# Patient Record
Sex: Female | Born: 1970 | Race: White | Hispanic: No | Marital: Married | Smoking: Never smoker
Health system: Southern US, Community
[De-identification: ages and names within clinical notes are randomized; demographics above are authoritative.]

## PROBLEM LIST (undated history)

## (undated) DIAGNOSIS — I10 Essential (primary) hypertension: Secondary | ICD-10-CM

---

## 2011-11-21 DIAGNOSIS — F419 Anxiety disorder, unspecified: Secondary | ICD-10-CM | POA: Insufficient documentation

## 2012-03-21 DIAGNOSIS — K219 Gastro-esophageal reflux disease without esophagitis: Secondary | ICD-10-CM | POA: Insufficient documentation

## 2012-05-24 DIAGNOSIS — G8929 Other chronic pain: Secondary | ICD-10-CM | POA: Insufficient documentation

## 2012-05-24 DIAGNOSIS — M545 Low back pain: Secondary | ICD-10-CM | POA: Insufficient documentation

## 2013-01-04 DIAGNOSIS — G8929 Other chronic pain: Secondary | ICD-10-CM | POA: Insufficient documentation

## 2013-01-24 DIAGNOSIS — I10 Essential (primary) hypertension: Secondary | ICD-10-CM | POA: Insufficient documentation

## 2015-02-09 ENCOUNTER — Other Ambulatory Visit: Payer: Self-pay | Admitting: Thoracic Surgery

## 2015-02-09 ENCOUNTER — Ambulatory Visit
Admission: RE | Admit: 2015-02-09 | Discharge: 2015-02-09 | Disposition: A | Discharge status: Home / Self Care | Payer: Disability Insurance | Source: Ambulatory Visit | Attending: Thoracic Surgery | Admitting: Thoracic Surgery

## 2015-02-09 DIAGNOSIS — S9491XD Injury of unspecified nerve at ankle and foot level, right leg, subsequent encounter: Secondary | ICD-10-CM | POA: Insufficient documentation

## 2015-02-09 DIAGNOSIS — G5791 Unspecified mononeuropathy of right lower limb: Secondary | ICD-10-CM

## 2016-04-08 ENCOUNTER — Ambulatory Visit: Payer: Self-pay | Admitting: Podiatry

## 2016-04-20 ENCOUNTER — Encounter: Payer: Self-pay | Admitting: Podiatry

## 2016-04-20 ENCOUNTER — Ambulatory Visit (INDEPENDENT_AMBULATORY_CARE_PROVIDER_SITE_OTHER): Payer: BLUE CROSS/BLUE SHIELD

## 2016-04-20 ENCOUNTER — Ambulatory Visit (INDEPENDENT_AMBULATORY_CARE_PROVIDER_SITE_OTHER): Payer: BLUE CROSS/BLUE SHIELD | Admitting: Podiatry

## 2016-04-20 VITALS — BP 138/88 | HR 76 | Resp 16 | Ht 65.0 in | Wt 195.0 lb

## 2016-04-20 DIAGNOSIS — M216X9 Other acquired deformities of unspecified foot: Secondary | ICD-10-CM

## 2016-04-20 DIAGNOSIS — M722 Plantar fascial fibromatosis: Secondary | ICD-10-CM | POA: Diagnosis not present

## 2016-04-20 MED ORDER — DICLOFENAC SODIUM 75 MG PO TBEC: 75.0000 mg | DELAYED_RELEASE_TABLET | Freq: Two times a day (BID) | ORAL | 2 refills | Status: DC

## 2016-04-20 MED ORDER — TRIAMCINOLONE ACETONIDE 10 MG/ML IJ SUSP
10.0000 mg | Freq: Once | INTRAMUSCULAR | Status: AC
  Administered 2016-04-20: 10 mg

## 2016-04-20 NOTE — Progress Notes (Signed)
Subjective:     Patient ID: Rita SchlichterYonna Townsend, female   DOB: 09-Oct-1970, 46 y.o.   MRN: [id number]  HPI patient presents with long-term problems with her feet with severe keratotic lesion formations and more recent chronic plantar fasciitis bilateral. Patient states his big on for number years and getting worse   Review of Systems  All other systems reviewed and are negative.      Objective:   Physical Exam  Constitutional: She is oriented to person, place, and time.  Cardiovascular: Intact distal pulses.   Musculoskeletal: Normal range of motion.  Neurological: She is oriented to person, place, and time.  Skin: Skin is warm.  Nursing note and vitals reviewed.  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 Neck and plantar fascial symptomatology bilateral that makes it hard for her to be active or walk and she is moderately obese     Assessment:     Chronic fasciitis bilateral heels along with severe keratotic lesion secondary to foot structure    Plan:     H&P x-rays reviewed conditions discussed and discussed with the patient the relationship to foot structure. At this time we'll focus on the heels and I injected the plantar fascial bilateral 3 mg Kenalog 5 mill grams Xylocaine and applied fascial brace is bilateral and went ahead and placed on diclofenac 75 mg twice a day. I think this patient will benefit from a softer type orthotic to try to cushion the forefoot but I want to wait see what type depending on response to medication  X-ray report indicates there is spur in the plantar heel high cavus foot structure and no indications of stress fracture

## 2016-04-20 NOTE — Progress Notes (Signed)
   Subjective:    Patient ID: Rita SchlichterYonna Townsend, female    DOB: 11-02-1970, 46 y.o.   MRN: [id number]  HPI Chief Complaint  Patient presents with  . Foot Pain    Bilateral; bottom of heel; knots on medial side; pt stated, "Has had knot on Left foot for 2 years; has had knot on right foot for past 2 months"  . Callouses    Bilateral; great toes & plantar forefoot-below great toe; pt stated, "Has used a pummice stone with relief"; x2 years      Review of Systems  All other systems reviewed and are negative.      Objective:   Physical Exam        Assessment & Plan:

## 2016-04-20 NOTE — Patient Instructions (Signed)

## 2016-04-25 ENCOUNTER — Telehealth: Payer: Self-pay | Admitting: *Deleted

## 2016-04-25 NOTE — Telephone Encounter (Signed)
Pt left name, DOB and phone number. Pt states having severe cramping in plantar feet, heels and calf after 3-4 days of wearing the braces, stopped wearing the brace and the pain has begun to go away. I asked pt how she was putting the brace on and she states she takes it apart to put it on each time and it is comfortable at first then becomes painful. I instructed pt to stop the braces, go in to shoes with arch support, and begin ice therapy 3-4 times a day for 15-20 minutes each session, continue the diclofenac. Pt states she is taking both ibuprofen and diclofenac. I told her just to take the diclofenac, because taking both could cause stomach problems or cancel each other out. Pt states understanding and state she had Finn Comfort clog, because she can't wear tie shoes. I told her the clogs may be increasing the inflammation if she has to use her toes to grip to hold the clog on. I told pt to call for an earlier appt if problems.  Pt states understanding.

## 2016-05-04 ENCOUNTER — Encounter: Payer: Self-pay | Admitting: Podiatry

## 2016-05-04 ENCOUNTER — Ambulatory Visit (INDEPENDENT_AMBULATORY_CARE_PROVIDER_SITE_OTHER): Payer: BLUE CROSS/BLUE SHIELD | Admitting: Podiatry

## 2016-05-04 DIAGNOSIS — M722 Plantar fascial fibromatosis: Secondary | ICD-10-CM

## 2016-05-04 MED ORDER — TRIAMCINOLONE ACETONIDE 10 MG/ML IJ SUSP
10.0000 mg | Freq: Once | INTRAMUSCULAR | Status: AC
  Administered 2016-05-04: 10 mg

## 2016-05-06 NOTE — Progress Notes (Signed)
Subjective:     Patient ID: Rita Townsend, female   DOB: [birth year], 46 y.o.   MRN: [id number]  HPI patient presents stating I'm improved but I'm still having some discomfort in my heel with ambulation and states the left one is really bothering her with a right significantly improved   Review of Systems     Objective:   Physical Exam Neurovascular status intact muscle strength adequate range of motion within normal limits with patient's heel still being sore on the left with inflammation in the right one significantly improved    Assessment:     Improvement of fascially right with inflammation noted left    Plan:     Injected the left fascia 3 mg Kenalog 5 mg Xylocaine and instructed on physical therapy anti-inflammatories

## 2016-05-25 ENCOUNTER — Ambulatory Visit: Payer: BLUE CROSS/BLUE SHIELD | Admitting: Podiatry

## 2016-06-27 ENCOUNTER — Ambulatory Visit (INDEPENDENT_AMBULATORY_CARE_PROVIDER_SITE_OTHER): Payer: BLUE CROSS/BLUE SHIELD | Admitting: Podiatry

## 2016-06-27 DIAGNOSIS — M216X9 Other acquired deformities of unspecified foot: Secondary | ICD-10-CM

## 2016-06-27 DIAGNOSIS — M722 Plantar fascial fibromatosis: Secondary | ICD-10-CM

## 2016-06-27 MED ORDER — TRIAMCINOLONE ACETONIDE 10 MG/ML IJ SUSP
10.0000 mg | Freq: Once | INTRAMUSCULAR | Status: AC
  Administered 2016-06-27: 10 mg

## 2016-06-27 NOTE — Progress Notes (Signed)
Subjective:    Patient ID: Rita SchlichterYonna Townsend, female   DOB: 46 y.o.   MRN: [id number]   HPI patient continues to have pain in the heel left over right with inflammation fluid around the medial band and states that it was very bad recently when she wore a flat shoe    ROS      Objective:  Physical Exam neurovascular status intact negative Homans sign was noted with patient noted to have exquisite discomfort heel left over right with inflammation fluid around the medial band     Assessment:   Continued acute plantar fasciitis left over right      Plan:    H&P condition reviewed and at this point I did just for the left inject the plantar fascia 3 Kenalog 5 mill grams Xylocaine I dispensed a night splint with instructions on usage along with ice packs and I went ahead and I scanned for custom orthotics to reduce plantar pressure against the feet and hopefully prevent her from needing surgery

## 2016-06-29 ENCOUNTER — Telehealth: Payer: Self-pay | Admitting: *Deleted

## 2016-06-29 NOTE — Telephone Encounter (Signed)
Pt states the prescription was not called to the pharmacy. I told pt Dr. Charlsie Merlesegal gave her the antiinflammatory medication in the injection. Pt states understanding.

## 2016-07-18 ENCOUNTER — Ambulatory Visit (INDEPENDENT_AMBULATORY_CARE_PROVIDER_SITE_OTHER): Payer: BLUE CROSS/BLUE SHIELD | Admitting: Podiatry

## 2016-07-18 DIAGNOSIS — M722 Plantar fascial fibromatosis: Secondary | ICD-10-CM

## 2016-07-18 MED ORDER — TRIAMCINOLONE ACETONIDE 10 MG/ML IJ SUSP
10.0000 mg | Freq: Once | INTRAMUSCULAR | Status: AC
  Administered 2016-07-18: 10 mg

## 2016-07-18 NOTE — Patient Instructions (Signed)

## 2016-07-18 NOTE — Progress Notes (Signed)
Subjective:    Patient ID: Rita Townsend, female   DOB: 46 y.o.   MRN: [id number]   HPI patient states I'm having quite a bit of pain in my right heel but my left feels quite a bit better    ROS      Objective:  Physical Exam neurovascular status intact with patient found to have significant discomfort right plantar fashion insertional point tendon calcaneus with the left doing well     Assessment:   Plantar fasciitis right over left heel with orthotics that are now here      Plan:    Dispensed orthotics and injected the right plantar fascia 3 mg Kenalog 5 mg Xylocaine

## 2016-10-28 ENCOUNTER — Encounter (HOSPITAL_BASED_OUTPATIENT_CLINIC_OR_DEPARTMENT_OTHER): Payer: Self-pay | Admitting: *Deleted

## 2016-10-28 ENCOUNTER — Emergency Department (HOSPITAL_BASED_OUTPATIENT_CLINIC_OR_DEPARTMENT_OTHER): Payer: BLUE CROSS/BLUE SHIELD

## 2016-10-28 ENCOUNTER — Emergency Department (HOSPITAL_BASED_OUTPATIENT_CLINIC_OR_DEPARTMENT_OTHER)
Admission: EM | Admit: 2016-10-28 | Discharge: 2016-10-28 | Disposition: A | Discharge status: Home / Self Care | Payer: BLUE CROSS/BLUE SHIELD | Attending: Emergency Medicine | Admitting: Emergency Medicine

## 2016-10-28 DIAGNOSIS — I10 Essential (primary) hypertension: Secondary | ICD-10-CM | POA: Insufficient documentation

## 2016-10-28 DIAGNOSIS — Z79899 Other long term (current) drug therapy: Secondary | ICD-10-CM | POA: Insufficient documentation

## 2016-10-28 DIAGNOSIS — R1010 Upper abdominal pain, unspecified: Secondary | ICD-10-CM | POA: Diagnosis not present

## 2016-10-28 DIAGNOSIS — Z791 Long term (current) use of non-steroidal anti-inflammatories (NSAID): Secondary | ICD-10-CM | POA: Diagnosis not present

## 2016-10-28 HISTORY — DX: Essential (primary) hypertension: I10

## 2016-10-28 LAB — CBC WITH DIFFERENTIAL/PLATELET
BASOS ABS: 0 10*3/uL (ref 0.0–0.1)
BASOS PCT: 0 %
EOS ABS: 0.1 10*3/uL (ref 0.0–0.7)
Eosinophils Relative: 1 %
HCT: 42.8 % (ref 36.0–46.0)
HEMOGLOBIN: 14.1 g/dL (ref 12.0–15.0)
Lymphocytes Relative: 25 %
Lymphs Abs: 1.6 10*3/uL (ref 0.7–4.0)
MCH: 28.9 pg (ref 26.0–34.0)
MCHC: 32.9 g/dL (ref 30.0–36.0)
MCV: 87.7 fL (ref 78.0–100.0)
Monocytes Absolute: 0.6 10*3/uL (ref 0.1–1.0)
Monocytes Relative: 10 %
Neutro Abs: 4.1 10*3/uL (ref 1.7–7.7)
Neutrophils Relative %: 64 %
Platelets: 259 10*3/uL (ref 150–400)
RBC: 4.88 MIL/uL (ref 3.87–5.11)
RDW: 13.6 % (ref 11.5–15.5)
WBC: 6.5 10*3/uL (ref 4.0–10.5)

## 2016-10-28 LAB — COMPREHENSIVE METABOLIC PANEL
ALBUMIN: 4.4 g/dL (ref 3.5–5.0)
ALK PHOS: 63 U/L (ref 38–126)
ALT: 23 U/L (ref 14–54)
ANION GAP: 9 (ref 5–15)
AST: 24 U/L (ref 15–41)
BUN: 15 mg/dL (ref 6–20)
CALCIUM: 9.8 mg/dL (ref 8.9–10.3)
CO2: 27 mmol/L (ref 22–32)
Chloride: 102 mmol/L (ref 101–111)
Creatinine, Ser: 0.88 mg/dL (ref 0.44–1.00)
GFR calc Af Amer: >60 mL/min (ref >60)
GFR calc non Af Amer: >60 mL/min (ref >60)
GLUCOSE: 103 mg/dL — AB (ref 65–99)
Potassium: 3.6 mmol/L (ref 3.5–5.1)
SODIUM: 138 mmol/L (ref 135–145)
Total Bilirubin: 0.6 mg/dL (ref 0.3–1.2)
Total Protein: 8.2 g/dL — ABNORMAL HIGH (ref 6.5–8.1)

## 2016-10-28 LAB — LIPASE, BLOOD: Lipase: 32 U/L (ref 11–51)

## 2016-10-28 MED ORDER — IOPAMIDOL (ISOVUE-300) INJECTION 61%
100.0000 mL | Freq: Once | INTRAVENOUS | Status: AC | PRN
  Administered 2016-10-28: 100 mL via INTRAVENOUS

## 2016-10-28 MED ORDER — GI COCKTAIL ~~LOC~~
30.0000 mL | Freq: Once | ORAL | Status: AC
  Administered 2016-10-28: 30 mL via ORAL
  Filled 2016-10-28: qty 30

## 2016-10-28 NOTE — ED Notes (Signed)
Pt d/c home by andrea, rn, not this rn.

## 2016-10-28 NOTE — ED Triage Notes (Signed)
Pt reports abd discomfort and constipation, last bm 10 days ago, has used stool softeners, miralax, colon cleanse herbal pills, suppositories and a fleets enema with no results. md at bedside for exam.

## 2016-10-28 NOTE — ED Provider Notes (Signed)
[facility]   CSN: [id number] Arrival date & time: 10/28/16  5366     History   Chief Complaint Chief Complaint  Patient presents with  . Abdominal Pain    HPI Rita Townsend is a 46 y.o. female.  The history is provided by the patient.  Abdominal Pain   This is a new problem. The current episode started more than 1 week ago. The problem occurs constantly. The problem has not changed since onset.The pain is associated with an unknown factor. The pain is located in the epigastric region, LUQ and RUQ. The quality of the pain is aching and cramping. The pain is moderate. Associated symptoms include belching, nausea and constipation. Pertinent negatives include anorexia, fever, diarrhea, vomiting, dysuria and frequency. The symptoms are aggravated by drinking alcohol, eating and palpation. Nothing relieves the symptoms.   46 year old female who presents with abdominal pain. She has a history of hysterectomy, cholecystectomy, GERD. Reports 1 week history of abdominal pain, localized to the upper abdomen, cramping and aching in nature, constant, worse with eating and drinking. Been constipated for one week. Denies any vomiting, fever, or urinary complaints. Has taken stool softeners with MiraLAX daily, 2 Fleet enemas over the past 24 hours, and taken homeopathic bowel cleanse. Has not been able to have a bowel movement.    Past Medical History:  Diagnosis Date  . Hypertension     Patient Active Problem List   Diagnosis Date Noted  . Hypertension goal BP (blood pressure) < 140/90 01/24/2013  . 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 Chronic lower back pain 05/24/2012  . GERD (gastroesophageal reflux disease) 03/21/2012  . Hx of hepatitis C 03/21/2012  . Insomnia 03/21/2012  . Anxiety 11/21/2011    History reviewed. No pertinent surgical history.  OB History    No data available       Home Medications    Prior to Admission medications     Medication Sig Start Date End Date Taking? Authorizing Provider  hydrochlorothiazide (HYDRODIURIL) 25 MG tablet Take 25 mg by mouth daily.    [provider]  ibuprofen (ADVIL,MOTRIN) 800 MG tablet Take 800 mg by mouth 2 (two) times daily.    [provider]  omeprazole (PRILOSEC) 20 MG capsule Take 20 mg by mouth daily.    [provider]    Family History History reviewed. No pertinent family history.  Social History Social History  Substance Use Topics  . Smoking status: Never Smoker  . Smokeless tobacco: Never Used  . Alcohol use Not on file     Allergies   Patient has no known allergies.   Review of Systems Review of Systems  Constitutional: Negative for fever.  Gastrointestinal: Positive for abdominal pain, constipation and nausea. Negative for anorexia, diarrhea and vomiting.  Genitourinary: Negative for dysuria and frequency.  All other systems reviewed and are negative.    Physical Exam Updated Vital Signs BP 123/78 (BP Location: Left Arm)   Pulse 67   Temp 98.1 F (36.7 C) (Oral)   Ht  (1.651 m)   Wt 92.5 kg (204 lb)   SpO2 97%   BMI 33.95 kg/m   Physical Exam Physical Exam  Nursing note and vitals reviewed. Constitutional: Well developed, well nourished, non-toxic, and in no acute distress Head: Normocephalic and atraumatic.  Mouth/Throat: Oropharynx is clear and moist.  Neck: Normal range of motion. Neck supple.  Cardiovascular: Normal rate and regular rhythm.   Pulmonary/Chest: Effort normal and  breath sounds normal.  Abdominal: Soft. There is upper abdominal tenderness. There is no rebound and no guarding.  Musculoskeletal: Normal range of motion.  Neurological: Alert, no facial droop, fluent speech, moves all extremities symmetrically Skin: Skin is warm and dry.  Psychiatric: Cooperative   ED Treatments / Results  Labs (all labs ordered are listed, but only abnormal results are displayed) Labs Reviewed   COMPREHENSIVE METABOLIC PANEL - Abnormal; Notable for the following:       Result Value   Glucose, Bld 103 (*)    Total Protein 8.2 (*)    All other components within normal limits  CBC WITH DIFFERENTIAL/PLATELET  LIPASE, BLOOD    EKG  EKG Interpretation None       Radiology Ct Abdomen Pelvis W Contrast  Result Date: 10/28/2016 CLINICAL DATA:  Constipation. Abdominal pain. Unresponsive to adenoma is and laxatives. EXAM: CT ABDOMEN AND PELVIS WITH CONTRAST TECHNIQUE: Multidetector CT imaging of the abdomen and pelvis was performed using the standard protocol following bolus administration of intravenous contrast. CONTRAST:  ISOVUE-300 IOPAMIDOL (ISOVUE-300) INJECTION 61% COMPARISON:  None. FINDINGS: Lower chest: Normal Hepatobiliary: Liver parenchyma is normal. Previous cholecystectomy. No evidence of ductal dilatation. Pancreas: Normal Spleen: Normal Adrenals/Urinary Tract: Adrenal glands are normal. Kidneys are normal. No cyst, mass, stone or hydronephrosis. Stomach/Bowel: Normal. The patient does not have an unusual amount of fecal matter in the colon. In fact, there is very little fecal matter in the transverse or left colon. Small intestine appears normal. Appendix is normal. Vascular/Lymphatic: Normal Reproductive: Previous hysterectomy.  No pelvic mass. Other: No free fluid or air. Musculoskeletal: Mild lower lumbar degenerative changes. IMPRESSION: The bowel appears normal. There does not appear to be an abnormal amount of fecal matter in the colon. In fact, there is very little fecal matter in the transverse and left colon. Small bowel appears normal. Previous cholecystectomy and hysterectomy. Electronically Signed   By: Paulina Fusi M.D.   On: 10/28/2016 09:42    Procedures Procedures (including critical care time)  Medications Ordered in ED Medications  gi cocktail (Maalox,Lidocaine,Donnatal) (not administered)  iopamidol (ISOVUE-300) 61 % injection 100 mL (100 mLs  Intravenous Contrast Given 10/28/16 0923)     Initial Impression / Assessment and Plan / ED Course  I have reviewed the triage vital signs and the nursing notes.  Pertinent labs & imaging results that were available during my care of the patient were reviewed by me and considered in my medical decision making (see chart for details).     46 year old female with history of cholecystectomy and abdominal hysterectomy who presents with constipation, abdominal pain and distention. She is well-appearing and in no acute distress with normal vital signs on presentation. She has a soft and nonsurgical abdomen, but some pain noted in the upper abdomen. No evidence of fecal impaction. Has tried an extensive bowel regimen without good effect. CT abdomen pelvis are performed to evaluate for potential obstruction versus mass versus other acute processes. This is visualized and shows no acute intra-abdominal processes. Radiology also comments on their being a significant amount of stool within her colon. Overall blood work is also reassuring. With her reassuring workup, patient felt appropriate for outpatient management. She will continue stool softeners as needed, close PCP follow-up recommended. Strict return and follow-up instructions reviewed. She expressed understanding of all discharge instructions and felt comfortable with the plan of care.   Final Clinical Impressions(s) / ED Diagnoses   Final diagnoses:  Upper abdominal pain  New Prescriptions New Prescriptions   No medications on file     Lavera Guise, MD 10/28/16 1009

## 2016-10-28 NOTE — Discharge Instructions (Signed)
Your CT scan and blood work was reassuring. You do not have significant amount of stool on the CT. Continue miralax, and you can increase it as needed from once daily, to twice daily, to three times daily, and so forth to good effect.  Return without fail for worsening symptoms, including fever, worsening pain, intractable vomiting, or any other symptoms concerning to you. Follow-up with your primary care provider in one week.

## 2016-10-28 NOTE — ED Notes (Signed)
Patient transported to CT 

## 2016-10-28 NOTE — ED Notes (Signed)
ED Provider at bedside. 

## 2016-11-29 ENCOUNTER — Encounter: Payer: Self-pay | Admitting: Gastroenterology

## 2017-01-20 ENCOUNTER — Ambulatory Visit: Payer: Disability Insurance | Admitting: Gastroenterology

## 2017-02-23 ENCOUNTER — Other Ambulatory Visit: Payer: Self-pay | Admitting: Family Medicine

## 2017-02-23 ENCOUNTER — Ambulatory Visit
Admission: RE | Admit: 2017-02-23 | Discharge: 2017-02-23 | Disposition: A | Discharge status: Home / Self Care | Payer: BLUE CROSS/BLUE SHIELD | Source: Ambulatory Visit | Attending: Family Medicine | Admitting: Family Medicine

## 2017-02-23 DIAGNOSIS — M79641 Pain in right hand: Secondary | ICD-10-CM

## 2019-03-06 ENCOUNTER — Encounter: Payer: Self-pay | Admitting: Neurology

## 2019-03-06 ENCOUNTER — Other Ambulatory Visit: Payer: Self-pay

## 2019-03-06 DIAGNOSIS — R202 Paresthesia of skin: Secondary | ICD-10-CM

## 2019-03-15 ENCOUNTER — Emergency Department (HOSPITAL_COMMUNITY)
Admission: EM | Admit: 2019-03-15 | Discharge: 2019-03-15 | Disposition: A | Discharge status: Home / Self Care | Payer: BC Managed Care – PPO | Attending: Emergency Medicine | Admitting: Emergency Medicine

## 2019-03-15 ENCOUNTER — Other Ambulatory Visit: Payer: Self-pay

## 2019-03-15 ENCOUNTER — Emergency Department (HOSPITAL_COMMUNITY): Payer: BC Managed Care – PPO

## 2019-03-15 ENCOUNTER — Encounter (HOSPITAL_COMMUNITY): Payer: Self-pay

## 2019-03-15 DIAGNOSIS — Z20822 Contact with and (suspected) exposure to covid-19: Secondary | ICD-10-CM | POA: Diagnosis not present

## 2019-03-15 DIAGNOSIS — I10 Essential (primary) hypertension: Secondary | ICD-10-CM | POA: Insufficient documentation

## 2019-03-15 DIAGNOSIS — R079 Chest pain, unspecified: Secondary | ICD-10-CM | POA: Diagnosis not present

## 2019-03-15 LAB — CBC
HCT: 41.4 % (ref 36.0–46.0)
Hemoglobin: 13.8 g/dL (ref 12.0–15.0)
MCH: 29.5 pg (ref 26.0–34.0)
MCHC: 33.3 g/dL (ref 30.0–36.0)
MCV: 88.5 fL (ref 80.0–100.0)
Platelets: 239 10*3/uL (ref 150–400)
RBC: 4.68 MIL/uL (ref 3.87–5.11)
RDW: 13.2 % (ref 11.5–15.5)
WBC: 6.9 10*3/uL (ref 4.0–10.5)
nRBC: 0 % (ref 0.0–0.2)

## 2019-03-15 LAB — BASIC METABOLIC PANEL
Anion gap: 11 (ref 5–15)
BUN: 13 mg/dL (ref 6–20)
CO2: 25 mmol/L (ref 22–32)
Calcium: 9.7 mg/dL (ref 8.9–10.3)
Chloride: 105 mmol/L (ref 98–111)
Creatinine, Ser: 0.8 mg/dL (ref 0.44–1.00)
GFR calc Af Amer: >60 mL/min (ref >60)
GFR calc non Af Amer: >60 mL/min (ref >60)
Glucose, Bld: 121 mg/dL — ABNORMAL HIGH (ref 70–99)
Potassium: 3.4 mmol/L — ABNORMAL LOW (ref 3.5–5.1)
Sodium: 141 mmol/L (ref 135–145)

## 2019-03-15 LAB — TROPONIN I (HIGH SENSITIVITY)
Troponin I (High Sensitivity): <2 ng/L (ref <18)
Troponin I (High Sensitivity): <2 ng/L (ref <18)

## 2019-03-15 LAB — POC SARS CORONAVIRUS 2 AG -  ED: SARS Coronavirus 2 Ag: NEGATIVE

## 2019-03-15 LAB — D-DIMER, QUANTITATIVE (NOT AT ARMC): D-Dimer, Quant: 0.61 ug/mL-FEU — ABNORMAL HIGH (ref 0.00–0.50)

## 2019-03-15 MED ORDER — ALBUTEROL SULFATE HFA 108 (90 BASE) MCG/ACT IN AERS
2.0000 | INHALATION_SPRAY | Freq: Once | RESPIRATORY_TRACT | Status: AC
  Administered 2019-03-15: 15:00:00 2 via RESPIRATORY_TRACT
  Filled 2019-03-15: qty 6.7

## 2019-03-15 MED ORDER — SODIUM CHLORIDE (PF) 0.9 % IJ SOLN
INTRAMUSCULAR | Status: AC
  Filled 2019-03-15: qty 50

## 2019-03-15 MED ORDER — FENTANYL CITRATE (PF) 100 MCG/2ML IJ SOLN
50.0000 ug | Freq: Once | INTRAMUSCULAR | Status: AC
  Administered 2019-03-15: 11:00:00 50 ug via INTRAVENOUS
  Filled 2019-03-15: qty 2

## 2019-03-15 MED ORDER — ASPIRIN 81 MG PO CHEW
324.0000 mg | CHEWABLE_TABLET | Freq: Once | ORAL | Status: AC
  Administered 2019-03-15: 11:00:00 324 mg via ORAL
  Filled 2019-03-15: qty 4

## 2019-03-15 MED ORDER — KETOROLAC TROMETHAMINE 15 MG/ML IJ SOLN
15.0000 mg | Freq: Once | INTRAMUSCULAR | Status: AC
  Administered 2019-03-15: 15:00:00 15 mg via INTRAVENOUS
  Filled 2019-03-15: qty 1

## 2019-03-15 MED ORDER — NAPROXEN 500 MG PO TABS: 500.0000 mg | ORAL_TABLET | Freq: Two times a day (BID) | ORAL | 0 refills | Status: AC

## 2019-03-15 MED ORDER — IOHEXOL 350 MG/ML SOLN
100.0000 mL | Freq: Once | INTRAVENOUS | Status: AC | PRN
  Administered 2019-03-15: 100 mL via INTRAVENOUS

## 2019-03-15 MED ORDER — BENZONATATE 100 MG PO CAPS: 100.0000 mg | ORAL_CAPSULE | Freq: Three times a day (TID) | ORAL | 0 refills | Status: AC

## 2019-03-15 NOTE — ED Triage Notes (Signed)
49 yo female c/o midsternal chest pressure since yesterday. Pain sharp with deep breath. Denies SHOB. No n/v or abd pain. C/O minor non-productive cough with HA. Denies ST. Pt had a temp of 101.5 at work on Tuesday and sent home. Denies fever since but +chills.

## 2019-03-15 NOTE — ED Provider Notes (Addendum)
[facility]-EMERGENCY DEPT Provider Note   CSN: [id number] Arrival date & time: 03/15/19  1002     History Chief Complaint  Patient presents with   Chest Pain    Rita Townsend is a 49 y.o. female with a history of anxiety, GERD, and hypertension who presents to the emergency department per PCP for evaluation of chest pain that began yesterday.  Patient states the chest discomfort is substernal, constant, feels like pressure/tightness that becomes sharp and worse with a deep breath.  No other alleviating or aggravating factors.  No change with exertion.  She states that it started yesterday and became more severe today.  She states that on Tuesday they checked her temperature at work per screening protocol and she had a fever, they sent her home told her to get Covid tested today.  Her PCP set up a Covid test today which was performed and is not resulted yet.  States that she has not had any fevers at home but does report some chills, headache, and dry cough.  Denies diaphoresis, nausea, vomiting, dyspnea, syncope, leg pain/swelling, hemoptysis, recent surgery/trauma, recent long travel, hormone use, personal hx of cancer, or hx of DVT/PE.   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   OB History   No obstetric history on file.     No family history on file.  Social History   Tobacco Use   Smoking status: Never Smoker   Smokeless tobacco: Never Used  Substance Use Topics   Alcohol use: Never   Drug use: Never    Home Medications Prior to Admission medications    Medication Sig Start Date End Date Taking? Authorizing Provider  hydrochlorothiazide (HYDRODIURIL) 25 MG tablet Take 25 mg by mouth daily.    [provider]  ibuprofen (ADVIL,MOTRIN) 800 MG tablet Take 800 mg by mouth 2 (two) times daily.    [provider]  omeprazole (PRILOSEC) 20 MG capsule Take 20 mg by mouth daily.    [provider]    Allergies    Patient has no known allergies.  Review of Systems   Review of Systems  Constitutional: Positive for chills. Negative for fever.  Eyes: Negative for visual disturbance.  Respiratory: Positive for cough. Negative for shortness of breath.   Cardiovascular: Positive for chest pain. Negative for leg swelling.  Gastrointestinal: Negative for abdominal pain, blood in stool, constipation, diarrhea, nausea and vomiting.  Genitourinary: Negative for dysuria.  Neurological: Positive for headaches. Negative for dizziness, seizures, syncope, speech difficulty, weakness, light-headedness and numbness.  All other systems reviewed and are negative.   Physical Exam Updated Vital Signs BP (!) 150/87    Pulse 68    Temp 98.3 F (36.8 C) (Oral)    Resp (!) 21    Ht 5\' 5"  (1.651 m)    Wt 94.3 kg    SpO2 96%    BMI 34.61 kg/m    Physical Exam Vitals and nursing note reviewed.  Constitutional:      General: She is not in acute distress.  Appearance: She is well-developed. She is not toxic-appearing.  HENT:     Head: Normocephalic and atraumatic.  Eyes:     General:        Right eye: No discharge.        Left eye: No discharge.     Conjunctiva/sclera: Conjunctivae normal.  Cardiovascular:     Rate and Rhythm: Normal rate and regular rhythm.     Pulses:          Radial pulses are 2+ on the right side and 2+ on the left side.  Pulmonary:     Effort: Pulmonary effort is normal. No respiratory distress.     Breath sounds: Normal breath sounds. No wheezing, rhonchi or rales.  Chest:     Chest wall: No tenderness,  crepitus or edema.     Comments: Just under R breast patient has a 1cm well healed area with 1 sutures in place. No signs of infection.  Abdominal:     General: There is no distension.     Palpations: Abdomen is soft.     Tenderness: There is no abdominal tenderness.  Musculoskeletal:     Cervical back: Neck supple.     Right lower leg: No tenderness. No edema.     Left lower leg: No tenderness. No edema.  Skin:    General: Skin is warm and dry.     Findings: No rash.  Neurological:     Mental Status: She is alert.     Comments: Clear speech.   Psychiatric:        Behavior: Behavior normal.     ED Results / Procedures / Treatments   Labs (all labs ordered are listed, but only abnormal results are displayed) Labs Reviewed  BASIC METABOLIC PANEL - Abnormal; Notable for the following components:      Result Value   Potassium 3.4 (*)    Glucose, Bld 121 (*)    All other components within normal limits  D-DIMER, QUANTITATIVE (NOT AT [facility]) - Abnormal; Notable for the following components:   D-Dimer, Quant 0.61 (*)    All other components within normal limits  CBC  POC SARS CORONAVIRUS 2 AG -  ED  TROPONIN I (HIGH SENSITIVITY)  TROPONIN I (HIGH SENSITIVITY)    EKG None  Radiology CT Angio Chest PE W/Cm &/Or Wo Cm  Result Date: 03/15/2019 CLINICAL DATA:  Central chest pain. Positive D-dimer. EXAM: CT ANGIOGRAPHY CHEST WITH CONTRAST TECHNIQUE: Multidetector CT imaging of the chest was performed using the standard protocol during bolus administration of intravenous contrast. Multiplanar CT image reconstructions and MIPs were obtained to evaluate the vascular anatomy. CONTRAST:  192mL OMNIPAQUE IOHEXOL 350 MG/ML SOLN COMPARISON:  Chest x-ray dated 03/15/2019 FINDINGS: Cardiovascular: Satisfactory opacification of the pulmonary arteries to the segmental level. No evidence of pulmonary embolism. Normal heart size. No pericardial effusion. Mediastinum/Nodes: No enlarged mediastinal,  hilar, or axillary lymph nodes. Trachea and esophagus demonstrate no significant findings. Lungs/Pleura: Lungs are clear. No pleural effusion or pneumothorax. Upper Abdomen: No acute abnormality. Cholecystectomy. Musculoskeletal: No chest wall abnormality. No acute or significant osseous findings. Review of the MIP images confirms the above findings. IMPRESSION: Normal exam. Electronically Signed   By: Lorriane Shire M.D.   On: 03/15/2019 12:59   DG Chest Port 1 View  Result Date: 03/15/2019 CLINICAL DATA:  Chest pain EXAM: PORTABLE CHEST 1 VIEW COMPARISON:  None. FINDINGS: The heart size and mediastinal contours are within normal limits. No focal airspace consolidation, pleural effusion, or  pneumothorax. The visualized skeletal structures are unremarkable. IMPRESSION: No active disease. Electronically Signed   By: Duanne Guess D.O.   On: 03/15/2019 11:00    Procedures Procedures (including critical care time)  Medications Ordered in ED Medications  sodium chloride (PF) 0.9 % injection (has no administration in time range)  aspirin chewable tablet 324 mg (324 mg Oral Given 03/15/19 1055)  fentaNYL (SUBLIMAZE) injection 50 mcg (50 mcg Intravenous Given 03/15/19 1056)  iohexol (OMNIPAQUE) 350 MG/ML injection 100 mL (100 mLs Intravenous Contrast Given 03/15/19 1231)    ED Course  I have reviewed the triage vital signs and the nursing notes.  Pertinent labs & imaging results that were available during my care of the patient were reviewed by me and considered in my medical decision making (see chart for details).    Rita Townsend was evaluated in Emergency Department on 03/15/2019 for the symptoms described in the history of present illness. He/she was evaluated in the context of the global COVID-19 pandemic, which necessitated consideration that the patient might be at risk for infection with the SARS-CoV-2 virus that causes COVID-19. Institutional protocols and algorithms that pertain to the  evaluation of patients at risk for COVID-19 are in a state of rapid change based on information released by regulatory bodies including the CDC and federal and state organizations. These policies and algorithms were followed during the patient's care in the ED.  MDM Rules/Calculators/A&P                      Patient presents to the emergency department with chest pain. Has outpatient covid test pending. Patient nontoxic appearing, in no apparent distress, vitals without significant abnormality, BP elevated. Fairly benign physical exam.   DDX: ACS, pulmonary embolism, dissection, pneumothorax, pneumonia, arrhythmia, severe anemia, MSK, GERD, anxiety, pericarditis. Evaluation initiated with labs, EKG, and CXR. Patient on cardiac monitor.   Rapid Covid test: Negative, outpatient testing by PCP pending. CBC: No anemia or leukocytosis. BMP: Mild hypokalemia, no significant electrolyte derangement.  Renal function preserved. Troponin: < 2, < 2- flat w/o significant elevation.  EKG: No STEMI CXR:  Negative, without infiltrate, effusion, pneumothorax, or fracture/dislocation.  D-dimer positive--> Subsequent CTA negative for PE or other acute process.   [place] Pathway Score low risk- EKG no STEMI, delta troponin negative, doubt ACS. Patient is low risk wells, ddimer positive, CTA w/o PE. Pain is not a tearing sensation, symmetric pulses, no widening of mediastinum on imaging, doubt dissection.  No notable arrhythmias on cardiac monitor.  No diffuse ST changes or pericardial effusion on imaging, doubt pericarditis.  Patient has appeared hemodynamically stable throughout ER visit and appears safe for discharge with close PCP/cardiology follow up.  Will provide symptomatic management.  I discussed results, treatment plan, need for PCP follow-up, and return precautions with the patient. Provided opportunity for questions, patient confirmed understanding and is in agreement with plan.   Final Clinical  Impression(s) / ED Diagnoses Final diagnoses:  Chest pain, unspecified type    Rx / DC Orders ED Discharge Orders         Ordered    naproxen (NAPROSYN) 500 MG tablet  2 times daily     03/15/19 1437    benzonatate (TESSALON) 100 MG capsule  Every 8 hours     03/15/19 1437           Elfie Costanza, Pleas Koch, PA-C 03/15/19 1438  Following discharge patient requested that her suture be removed to the area under her  right breast.  She states that this was placed after a cyst removal by her PCP.  She states she is past due to have the suture removed, however they will not see patients in clinic with Covid symptoms.  No signs of infection, wound appears well closed.  Suture removal per procedure below.  SUTURE REMOVAL Performed by: Harvie Heck  Consent: Verbal consent obtained. Patient identity confirmed: provided demographic data Time out: Immediately prior to procedure a "time out" was called to verify the correct patient, procedure, equipment, support staff and site/side marked as required.  Location details: Under R breast  Wound Appearance: clean  Sutures/Staples Removed: 1  Patient tolerance: Patient tolerated the procedure well with no immediate complications.       Desmond Lope 03/15/19 1531    Lorre Nick, MD 03/18/19 1208

## 2019-03-15 NOTE — Discharge Instructions (Addendum)
You were seen in the emergency department today for chest pain. Your work-up in the emergency department has been overall reassuring. Your labs have been fairly normal and or similar to previous blood work you have had done. Your EKG and the enzyme we use to check your heart did not show an acute heart attack at this time. Your chest x-ray was normal.  Your CT scan did not show signs of a blood clot or other significant abnormalities.  Your rapid Covid test was negative, please wait to hear results from your primary care provider for the outpatient test.  Continue to quarantine.  We are sending you with the following medicines to help with your symptoms:  - Naproxen is a nonsteroidal anti-inflammatory medication that will help with pain and swelling. Be sure to take this medication as prescribed with food, 1 pill every 12 hours,  It should be taken with food, as it can cause stomach upset, and more seriously, stomach bleeding. Do not take other nonsteroidal anti-inflammatory medications with this such as Advil, Motrin, Aleve, Mobic, Goodie Powder, or Motrin.    - Tessalon-this is a medicine to help with cough, you may take every 8 hours as needed.  We are also sending home with the inhaler given in the ER, use 1 to 2 puffs every 4-6 hours as needed for trouble breathing or chest tightness or wheezing..   You make take Tylenol per over the counter dosing with these medications.   We have prescribed you new medication(s) today. Discuss the medications prescribed today with your pharmacist as they can have adverse effects and interactions with your other medicines including over the counter and prescribed medications. Seek medical evaluation if you start to experience new or abnormal symptoms after taking one of these medicines, seek care immediately if you start to experience difficulty breathing, feeling of your throat closing, facial swelling, or rash as these could be indications of a more serious  allergic reaction   We would like you to follow up closely with your primary care provider and/or the cardiologist provided in your discharge instructions within 1-3 days. Return to the ER immediately should you experience any new or worsening symptoms including but not limited to return of pain, worsened pain, vomiting, shortness of breath, dizziness, lightheadedness, passing out, or any other concerns that you may have.

## 2019-03-15 NOTE — ED Notes (Signed)
Per PCP-states patient came in for covid testing this am-having chest pressure and pain-sending to ED for further testing

## 2019-03-15 NOTE — ED Notes (Signed)
ED provider Freida Busman notified of negative POC covid test results.

## 2019-03-27 ENCOUNTER — Other Ambulatory Visit: Payer: Self-pay

## 2019-03-27 ENCOUNTER — Ambulatory Visit (INDEPENDENT_AMBULATORY_CARE_PROVIDER_SITE_OTHER): Payer: BC Managed Care – PPO | Admitting: Neurology

## 2019-03-27 DIAGNOSIS — R202 Paresthesia of skin: Secondary | ICD-10-CM

## 2019-03-27 NOTE — Procedures (Signed)
[facility]  [address] [place], [address]  [place], [place] [address] Tel: [phone] Fax:  [phone] Test Date:  03/27/2019  Patient: Rita Townsend DOB: 12-09-1970 Physician: Nita Sickle, DO  Sex: Female Height: 5\' 5"  Ref Phys: , MD  ID#: [id number] Temp: 33.0C Technician:    Patient Complaints: This is a 49 year old female referred for evaluation of bilateral hand tingling and pain.  NCV & EMG Findings: Extensive electrodiagnostic testing of the right upper extremity and additional studies of the left shows: 1. Bilateral median, ulnar, and mixed palmar sensory responses are within normal limits. 2. Bilateral median and ulnar motor responses are within normal limits. 3. There is no evidence of active or chronic motor axonal loss changes affecting any of the tested muscles.  Motor unit configuration and recruitment pattern is within normal limits.   Impression: This is a normal study of the upper extremities.  In particular, there is no evidence of carpal tunnel syndrome, ulnar neuropathy, or cervical radiculopathy affecting either upper extremity.   ___________________________ 53, DO    Nerve Conduction Studies Anti Sensory Summary Table   Stim Site NR Peak (ms) Norm Peak (ms) P-T Amp (V) Norm P-T Amp  Left Median Anti Sensory (2nd Digit)  33C  Wrist    2.8 <3.4 33.2 >20  Right Median Anti Sensory (2nd Digit)  33C  Wrist    2.8 <3.4 30.9 >20  Left Ulnar Anti Sensory (5th Digit)  33C  Wrist    2.4 <3.1 35.4 >12  Right Ulnar Anti Sensory (5th Digit)  33C  Wrist    2.2 <3.1 41.7 >12   Motor Summary Table   Stim Site NR Onset (ms) Norm Onset (ms) O-P Amp (mV) Norm O-P Amp Site1 Site2 Delta-0 (ms) Dist (cm) Vel (m/s) Norm Vel (m/s)  Left Median Motor (Abd Poll Brev)  33C  Wrist    3.0 <3.9 7.1 >6 Elbow Wrist 4.5 26.0 58 >50  Elbow    7.5  6.7         Right Median Motor (Abd Poll Brev)  33C  Wrist    2.7 <3.9 8.0 >6 Elbow Wrist  4.3 26.0 60 >50  Elbow    7.0  7.5         Left Ulnar Motor (Abd Dig Minimi)  33C  Wrist    1.7 <3.1 9.4 >7 B Elbow Wrist 3.4 22.0 65 >50  B Elbow    5.1  8.6  A Elbow B Elbow 1.8 10.0 56 >50  A Elbow    6.9  8.2         Right Ulnar Motor (Abd Dig Minimi)  33C  Wrist    1.8 <3.1 9.1 >7 B Elbow Wrist 3.3 22.0 67 >50  B Elbow    5.1  8.1  A Elbow B Elbow 1.8 10.0 56 >50  A Elbow    6.9  7.7          Comparison Summary Table   Stim Site NR Peak (ms) Norm Peak (ms) P-T Amp (V) Site1 Site2 Delta-P (ms) Norm Delta (ms)  Left Median/Ulnar Palm Comparison (Wrist - 8cm)  33C  Median Palm    1.8 <2.2 71.7 Median Palm Ulnar Palm 0.3   Ulnar Palm    1.5 <2.2 21.0      Right Median/Ulnar Palm Comparison (Wrist - 8cm)  33C  Median Palm    1.6 <2.2 85.8 Median Palm Ulnar Palm 0.1   Ulnar Nita Sickle  1.5 <2.2 28.5       EMG   Side Muscle Ins Act Fibs Psw Fasc Number Recrt Dur Dur. Amp Amp. Poly Poly. Comment  Right 1stDorInt Nml Nml Nml Nml Nml Nml Nml Nml Nml Nml Nml Nml N/A  Right PronatorTeres Nml Nml Nml Nml Nml Nml Nml Nml Nml Nml Nml Nml N/A  Right Biceps Nml Nml Nml Nml Nml Nml Nml Nml Nml Nml Nml Nml N/A  Right Deltoid Nml Nml Nml Nml Nml Nml Nml Nml Nml Nml Nml Nml N/A  Right Triceps Nml Nml Nml Nml Nml Nml Nml Nml Nml Nml Nml Nml N/A  Left 1stDorInt Nml Nml Nml Nml Nml Nml Nml Nml Nml Nml Nml Nml N/A  Left Biceps Nml Nml Nml Nml Nml Nml Nml Nml Nml Nml Nml Nml N/A  Left PronatorTeres Nml Nml Nml Nml Nml Nml Nml Nml Nml Nml Nml Nml N/A  Left Triceps Nml Nml Nml Nml Nml Nml Nml Nml Nml Nml Nml Nml N/A  Left Deltoid Nml Nml Nml Nml Nml Nml Nml Nml Nml Nml Nml Nml N/A      Waveforms:

## 2019-09-20 ENCOUNTER — Other Ambulatory Visit: Payer: Self-pay | Admitting: Obstetrics and Gynecology

## 2019-09-20 ENCOUNTER — Other Ambulatory Visit: Payer: Self-pay

## 2019-09-20 ENCOUNTER — Ambulatory Visit
Admission: RE | Admit: 2019-09-20 | Discharge: 2019-09-20 | Disposition: A | Discharge status: Home / Self Care | Payer: BC Managed Care – PPO | Source: Ambulatory Visit | Attending: Obstetrics and Gynecology | Admitting: Obstetrics and Gynecology

## 2019-09-20 DIAGNOSIS — M898X1 Other specified disorders of bone, shoulder: Secondary | ICD-10-CM

## 2019-09-24 ENCOUNTER — Other Ambulatory Visit: Payer: Self-pay | Admitting: Obstetrics and Gynecology

## 2019-09-24 DIAGNOSIS — M25511 Pain in right shoulder: Secondary | ICD-10-CM

## 2019-09-25 ENCOUNTER — Other Ambulatory Visit: Payer: Self-pay | Admitting: Obstetrics and Gynecology

## 2019-09-27 ENCOUNTER — Ambulatory Visit
Admission: RE | Admit: 2019-09-27 | Discharge: 2019-09-27 | Disposition: A | Discharge status: Home / Self Care | Payer: BC Managed Care – PPO | Source: Ambulatory Visit | Attending: Obstetrics and Gynecology | Admitting: Obstetrics and Gynecology

## 2019-09-27 ENCOUNTER — Other Ambulatory Visit: Payer: Self-pay

## 2019-09-27 DIAGNOSIS — M25511 Pain in right shoulder: Secondary | ICD-10-CM

## 2019-09-27 MED ORDER — IOPAMIDOL (ISOVUE-300) INJECTION 61%
75.0000 mL | Freq: Once | INTRAVENOUS | Status: AC | PRN
  Administered 2019-09-27: 75 mL via INTRAVENOUS

## 2020-11-19 ENCOUNTER — Other Ambulatory Visit: Payer: Self-pay | Admitting: Family Medicine

## 2020-11-19 ENCOUNTER — Other Ambulatory Visit: Payer: Self-pay

## 2020-11-19 ENCOUNTER — Ambulatory Visit
Admission: RE | Admit: 2020-11-19 | Discharge: 2020-11-19 | Disposition: A | Discharge status: Home / Self Care | Payer: BC Managed Care – PPO | Source: Ambulatory Visit | Attending: Family Medicine | Admitting: Family Medicine

## 2020-11-19 DIAGNOSIS — T1490XA Injury, unspecified, initial encounter: Secondary | ICD-10-CM

## 2020-11-19 DIAGNOSIS — R609 Edema, unspecified: Secondary | ICD-10-CM

## 2020-11-19 DIAGNOSIS — T148XXA Other injury of unspecified body region, initial encounter: Secondary | ICD-10-CM

## 2021-10-04 IMAGING — CT CT ANGIO CHEST
3 of 7 series · 17 of 36 positions shown · IV contrast (OMNIPAQUE 300)
Comparison: Chest x-ray dated 03/15/2019

CLINICAL DATA: Central chest pain. Positive D-dimer.

EXAM:
CT ANGIOGRAPHY CHEST WITH CONTRAST
TECHNIQUE: Multidetector CT imaging of the chest was performed using the
standard protocol during bolus administration of intravenous
contrast. Multiplanar CT image reconstructions and MIPs were
obtained to evaluate the vascular anatomy.
CONTRAST:  100mL OMNIPAQUE IOHEXOL 350 MG/ML SOLN

[Series 4: lung · axial · 0.76mm/px · z∈[-160,-80]mm · 3 of 81 slices shown]
[im 21/81  mediastinal]
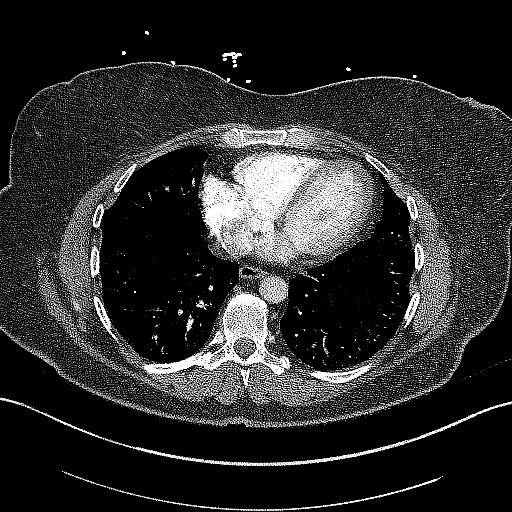
[im 41/81  mediastinal]
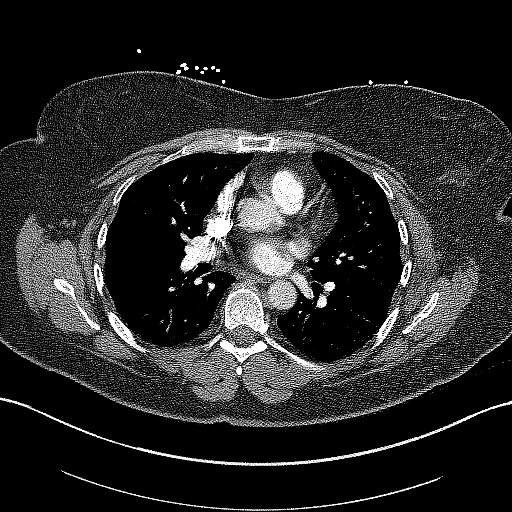
[im 61/81  mediastinal]
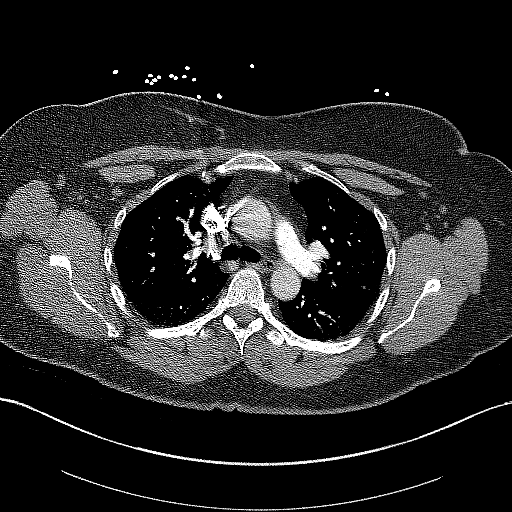

[Series 8: coronal mpr · coronal · 0.52mm/px · 1 of 151 slices shown]
[im 76/151  mediastinal]
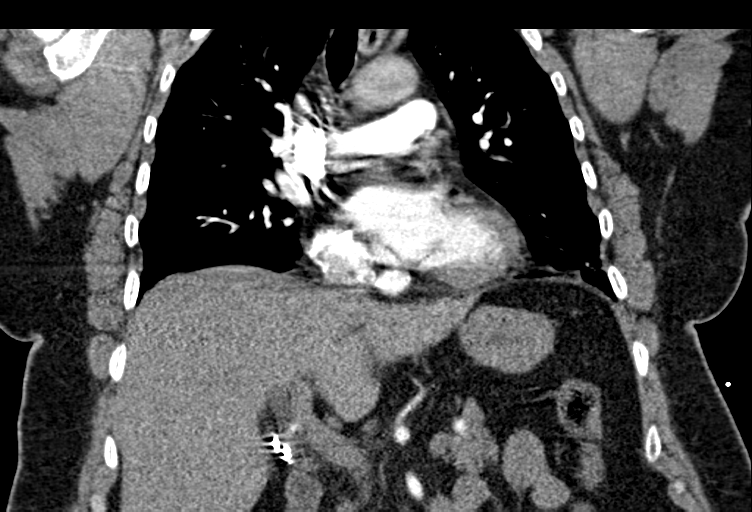

[Series 10: thins · axial · 0.79mm/px · z∈[-262,-58]mm · 13 of 240 slices shown]
[im 18/240  lung]
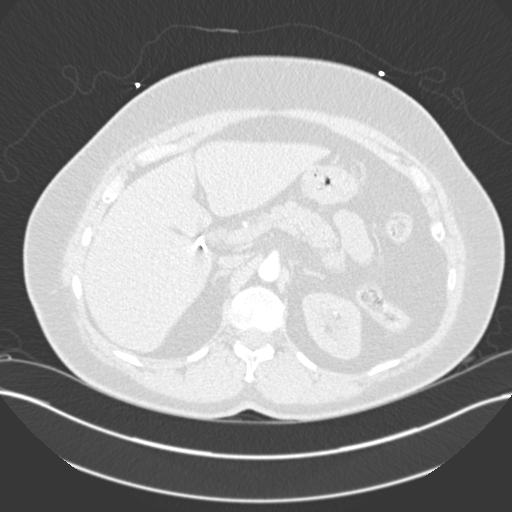
[im 35/240  mediastinal]
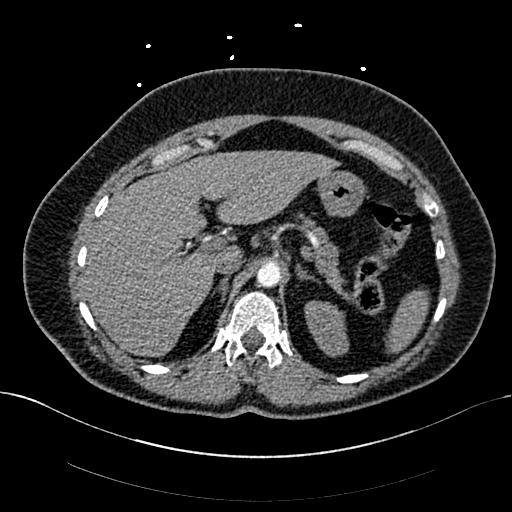
[im 52/240  lung]
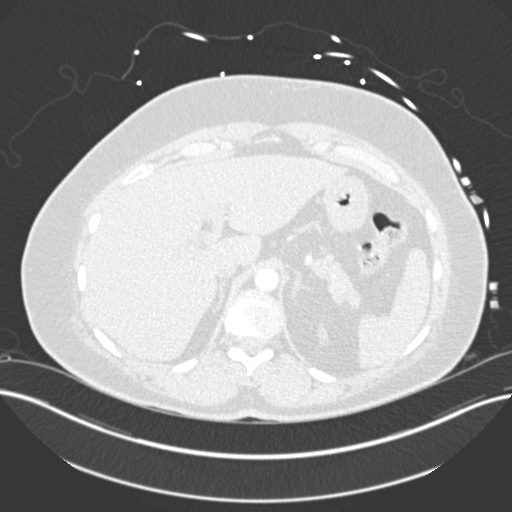
[im 69/240  mediastinal]
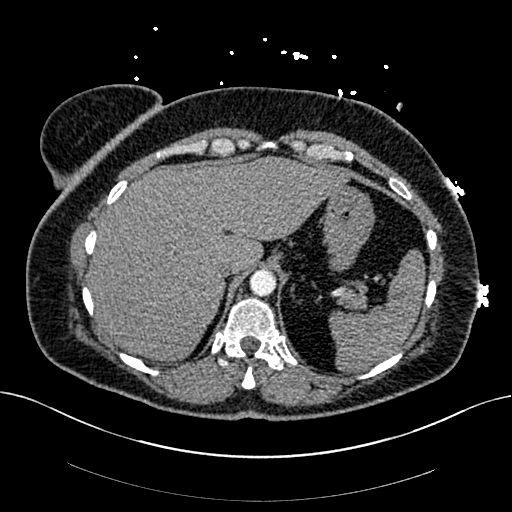
[im 86/240  lung]
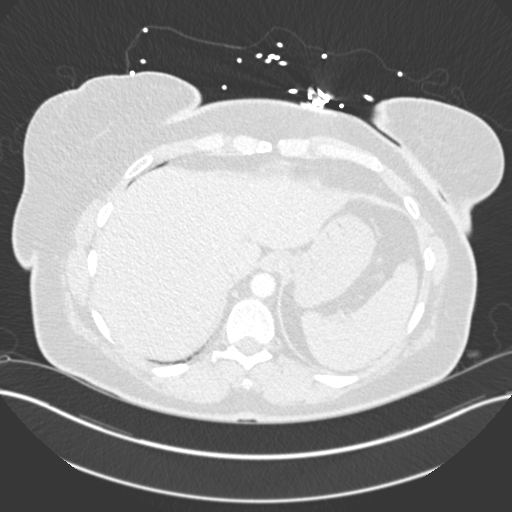
[im 103/240  mediastinal]
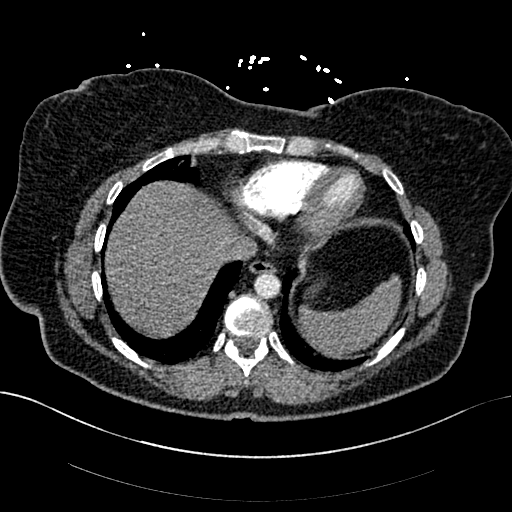
[im 120/240  lung]
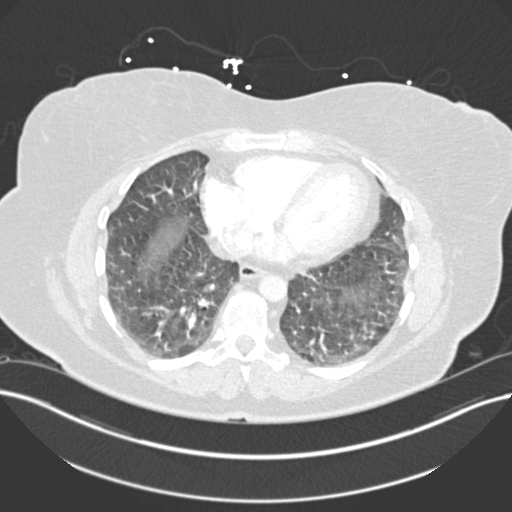
[im 137/240  mediastinal]
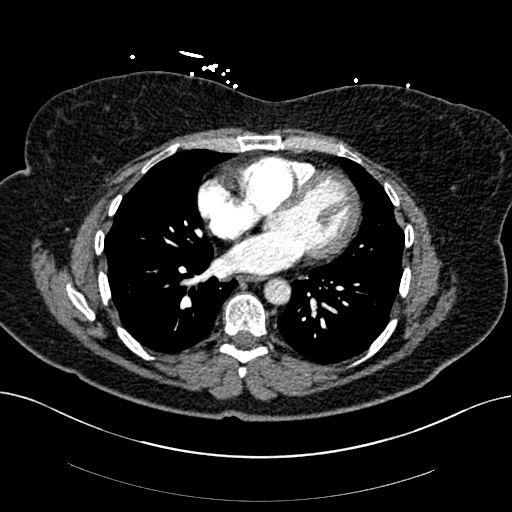
[im 154/240  lung]
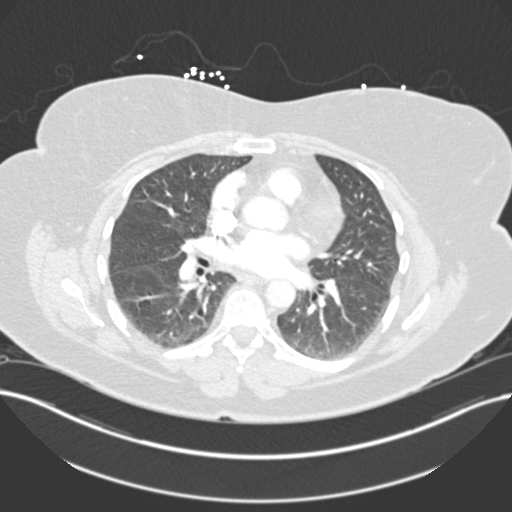
[im 171/240  mediastinal]
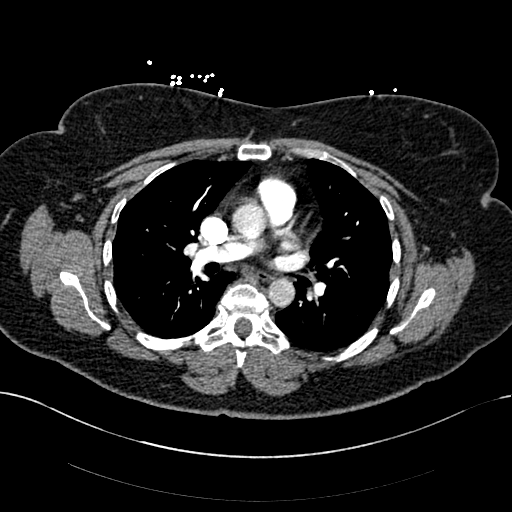
[im 188/240  lung]
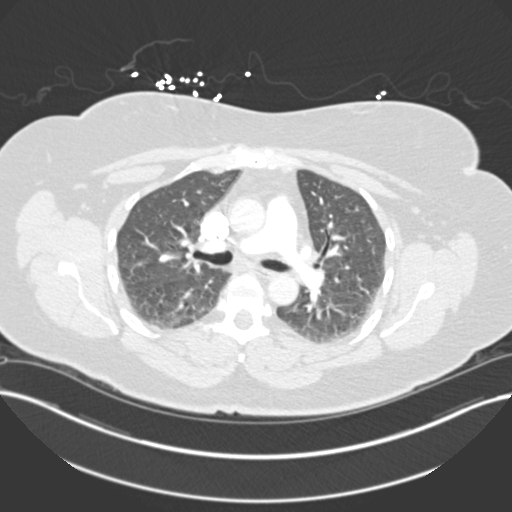
[im 205/240  mediastinal]
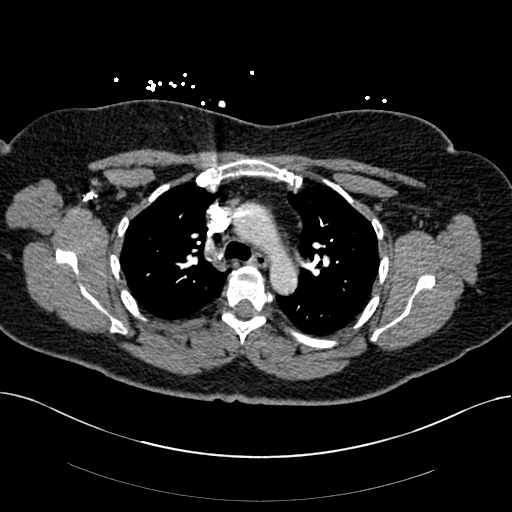
[im 222/240  lung]
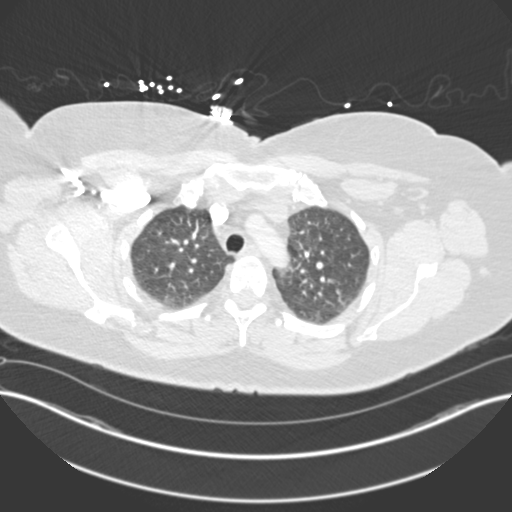

[17 of 36 positions shown; findings below may reference images not displayed]

FINDINGS: Cardiovascular: Satisfactory opacification of the pulmonary arteries
to the segmental level. No evidence of pulmonary embolism. Normal
heart size. No pericardial effusion.

Mediastinum/Nodes: No enlarged mediastinal, hilar, or axillary lymph
nodes. Trachea and esophagus demonstrate no significant findings.

Lungs/Pleura: Lungs are clear. No pleural effusion or pneumothorax.

Upper Abdomen: No acute abnormality. Cholecystectomy.

Musculoskeletal: No chest wall abnormality. No acute or significant
osseous findings.

Review of the MIP images confirms the above findings.
IMPRESSION: Normal exam.

## 2022-05-30 ENCOUNTER — Ambulatory Visit
Admission: EM | Admit: 2022-05-30 | Discharge: 2022-05-30 | Disposition: A | Discharge status: Home / Self Care | Payer: BC Managed Care – PPO

## 2022-05-30 DIAGNOSIS — L03113 Cellulitis of right upper limb: Secondary | ICD-10-CM | POA: Diagnosis not present

## 2022-05-30 MED ORDER — HYDROXYZINE HCL 25 MG PO TABS: 12.5000 mg | ORAL_TABLET | Freq: Three times a day (TID) | ORAL | 0 refills | Status: AC | PRN

## 2022-05-30 MED ORDER — DOXYCYCLINE HYCLATE 100 MG PO CAPS: 100.0000 mg | ORAL_CAPSULE | Freq: Two times a day (BID) | ORAL | 0 refills | Status: AC

## 2022-05-30 NOTE — ED Provider Notes (Signed)
[facility]  Note:  This document was prepared using Conservation officer, historic buildings and may include unintentional dictation errors.  MRN: [id number] DOB: [birth year]  Subjective:   Rita Townsend is a 52 y.o. female presenting for 1 day history of pain, swelling and hot sensation of the underside of her right proximal extremity.  Patient received a Prevnar 20 vaccine 05/25/2022.  No reaction was noted at the time.  No itching, swelling, redness, drainage pus or bleeding.  No current facility-administered medications for this encounter.  Current Outpatient Medications:    benzonatate (TESSALON) 100 MG capsule, Take 1 capsule (100 mg total) by mouth every 8 (eight) hours., Disp: 21 capsule, Rfl: 0   diclofenac (VOLTAREN) 75 MG EC tablet, Take 75 mg by mouth 2 (two) times daily., Disp: , Rfl:    hydrochlorothiazide (HYDRODIURIL) 25 MG tablet, Take 25 mg by mouth daily., Disp: , Rfl:    ibuprofen (ADVIL,MOTRIN) 800 MG tablet, Take 800 mg by mouth daily as needed for fever or headache. , Disp: , Rfl:    naproxen (NAPROSYN) 500 MG tablet, Take 1 tablet (500 mg total) by mouth 2 (two) times daily., Disp: 10 tablet, Rfl: 0   omeprazole (PRILOSEC) 40 MG capsule, Take 40 mg by mouth every other day. , Disp: , Rfl:    valACYclovir (VALTREX) 1000 MG tablet, Take 2 tablets by mouth See admin instructions. 2 Grams at first site of fever blister then 2 Grams in 12 hours., Disp: , Rfl:    Vitamin D, Ergocalciferol, (DRISDOL) 1.25 MG (50000 UNIT) CAPS capsule, Take 50,000 Units by mouth once a week. Monday, Disp: , Rfl:    No Known Allergies  Past Medical History:  Diagnosis Date   Hypertension      No past surgical history on file.  No family history on file.  Social History   Tobacco Use   Smoking status: Never   Smokeless tobacco: Never  Substance Use Topics   Alcohol use: Never   Drug use: Never    ROS   Objective:   Vitals: BP (!) 149/91 (BP Location:  Left Arm)   Pulse 63   Temp 98.4 F (36.9 C) (Oral)   Resp 16   SpO2 96%   Physical Exam Constitutional:      General: She is not in acute distress.    Appearance: Normal appearance. She is well-developed. She is not ill-appearing, toxic-appearing or diaphoretic.  HENT:     Head: Normocephalic and atraumatic.     Nose: Nose normal.     Mouth/Throat:     Mouth: Mucous membranes are moist.  Eyes:     General: No scleral icterus.       Right eye: No discharge.        Left eye: No discharge.     Extraocular Movements: Extraocular movements intact.  Cardiovascular:     Rate and Rhythm: Normal rate.  Pulmonary:     Effort: Pulmonary effort is normal.  Musculoskeletal:       Arms:  Skin:    General: Skin is warm and dry.  Neurological:     General: No focal deficit present.     Mental Status: She is alert and oriented to person, place, and time.  Psychiatric:        Mood and Affect: Mood normal.        Behavior: Behavior normal.     Assessment and Plan :   PDMP not reviewed this encounter.  1.  Cellulitis of right upper extremity    Recommend managing for secondary cellulitis of the proximal right upper extremity.  Start doxycycline for this.  Use hydroxyzine for any secondary localized reaction, antihistamine properties.  Follow-up with PCP.  Counseled patient on potential for adverse effects with medications prescribed/recommended today, ER and return-to-clinic precautions discussed, patient verbalized understanding.    Wallis Bamberg, [place] 05/30/22 1040

## 2022-05-30 NOTE — Discharge Instructions (Addendum)
We will be using doxycycline for secondary cellulitis. Use hydroxyzine for antihistamine properties like we would for allergic reactions.

## 2022-05-30 NOTE — ED Triage Notes (Signed)
Pt reports pain the right axilla x 1 day. Pt reports she had Prevnar 20 (Pneumococcal vaccine) in th right arm on 05/25/22. Pt is concern for a reaction of the vaccine or cellulitis.

## 2023-06-11 IMAGING — CR DG HAND COMPLETE 3+V*L*
3 series · 3 of 3 positions shown · non-contrast
Comparison: None.

CLINICAL DATA: Pain after hitting her hand on a car door over a
month ago.

EXAM:
LEFT HAND - COMPLETE 3+ VIEW

[x hand pa left]
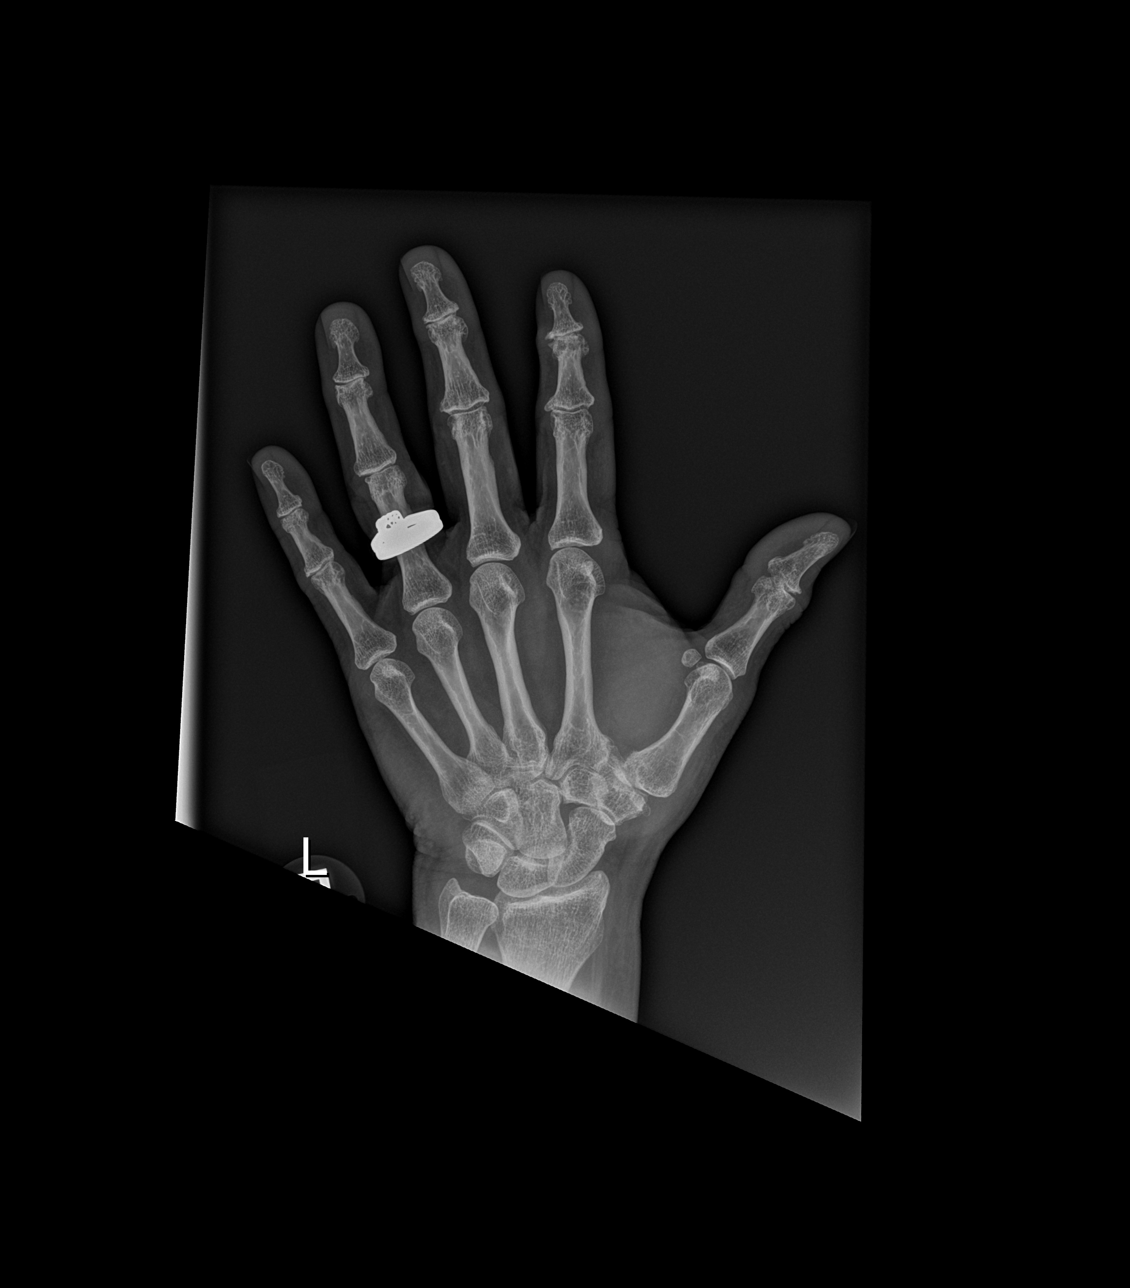

[x hand obl left]
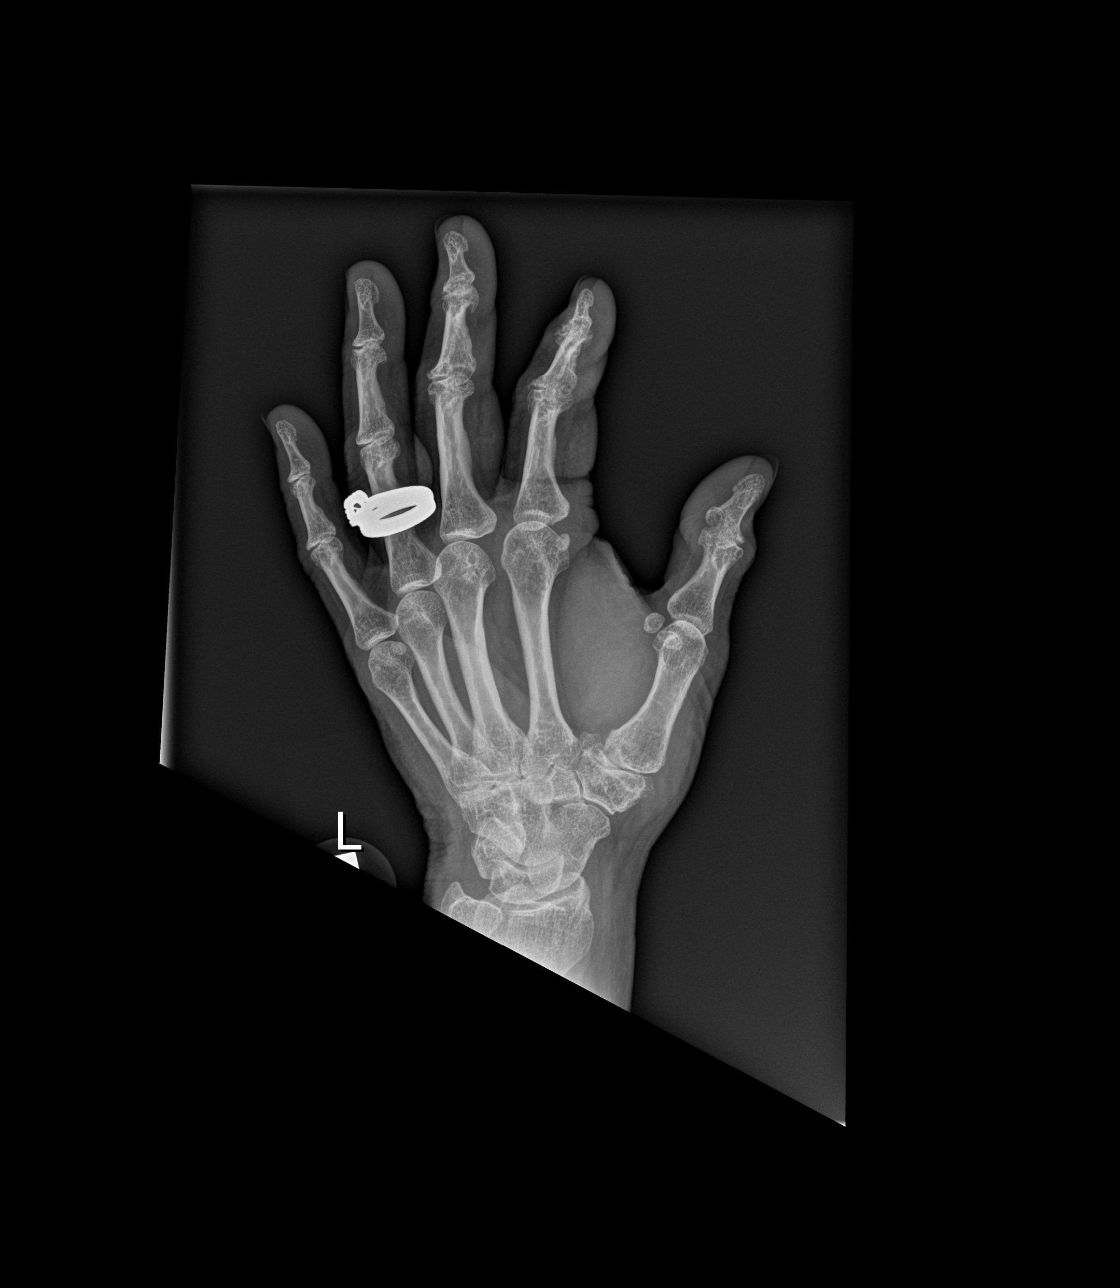

[x hand lat left]
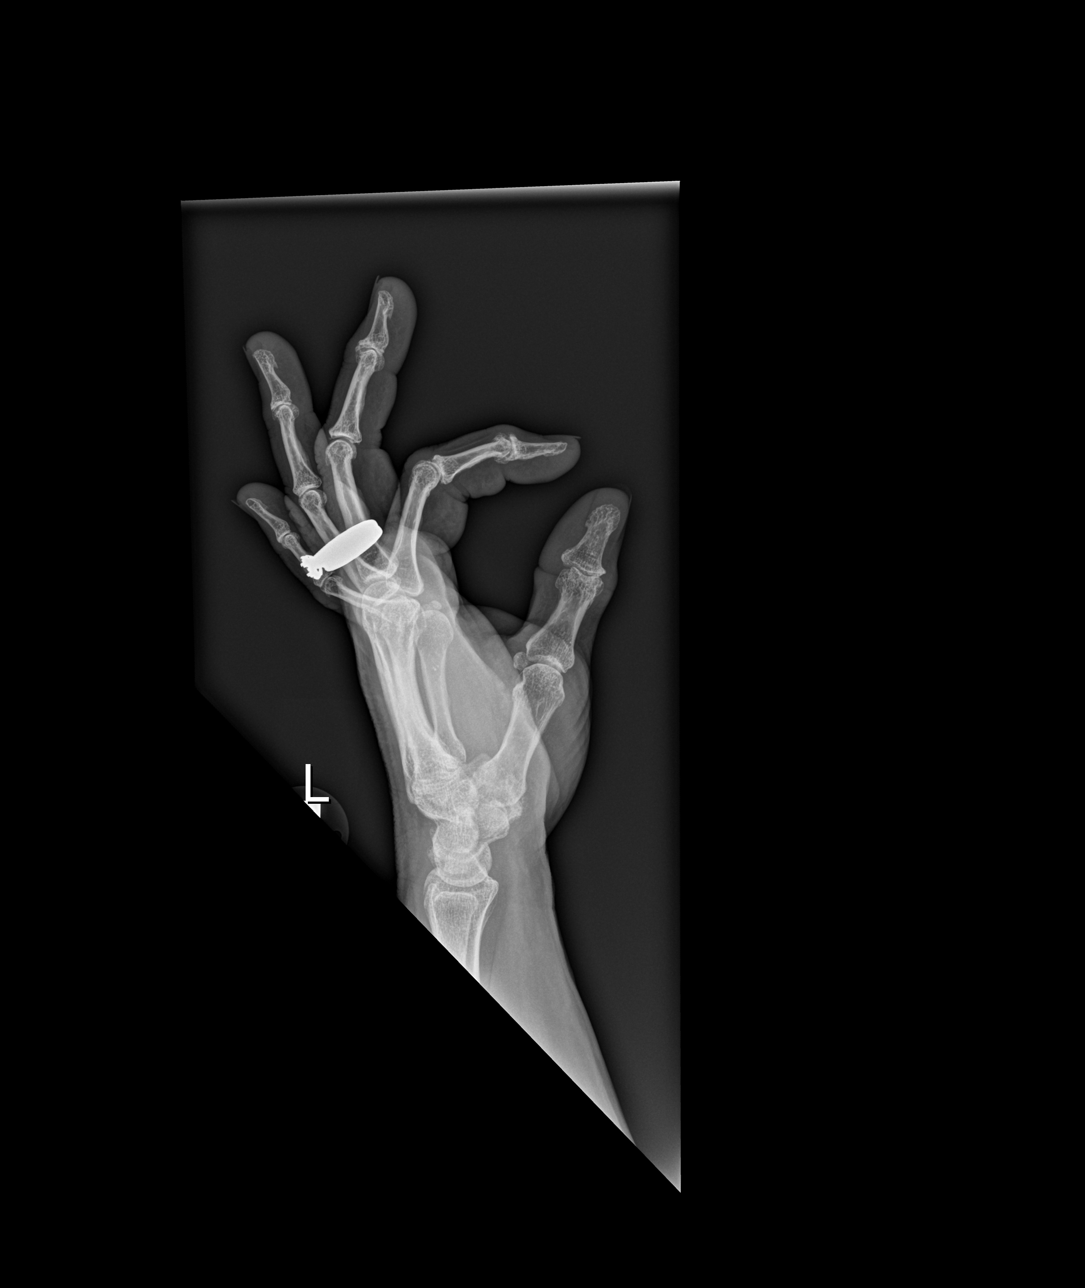

[3 of 3 positions shown; findings below may reference images not displayed]

FINDINGS: There is no evidence of fracture or dislocation. There are moderate
degenerative changes at the first carpal metacarpal joint. Mild
degenerative changes are seen in the distal interphalangeal joints.
The soft tissues are unremarkable.
IMPRESSION: No acute osseous injury.  Mild to moderate degenerative changes.
